# Patient Record
Sex: Male | Born: 1975
Health system: Southern US, Community
[De-identification: ages and names within clinical notes are randomized; demographics above are authoritative.]

---

## 2004-01-30 ENCOUNTER — Ambulatory Visit (HOSPITAL_COMMUNITY): Admission: RE | Admit: 2004-01-30 | Discharge: 2004-01-31 | Payer: Self-pay | Admitting: Neurosurgery

## 2004-04-03 ENCOUNTER — Inpatient Hospital Stay (HOSPITAL_COMMUNITY): Admission: AD | Admit: 2004-04-03 | Discharge: 2004-04-05 | Payer: Self-pay | Admitting: Neurosurgery

## 2017-07-31 ENCOUNTER — Ambulatory Visit (HOSPITAL_COMMUNITY): Payer: Self-pay | Admitting: Psychiatry

## 2017-08-12 ENCOUNTER — Encounter: Payer: Self-pay | Admitting: Podiatry

## 2017-08-12 ENCOUNTER — Encounter (INDEPENDENT_AMBULATORY_CARE_PROVIDER_SITE_OTHER): Payer: Self-pay

## 2017-08-12 ENCOUNTER — Ambulatory Visit (INDEPENDENT_AMBULATORY_CARE_PROVIDER_SITE_OTHER): Payer: 59 | Admitting: Podiatry

## 2017-08-12 VITALS — BP 113/66 | HR 82 | Ht 72.0 in | Wt 365.0 lb

## 2017-08-12 DIAGNOSIS — B351 Tinea unguium: Secondary | ICD-10-CM

## 2017-08-12 NOTE — Patient Instructions (Signed)
Pre-Operative Instructions  Congratulations, you have decided to take an important step towards improving your quality of life.  You can be assured that the doctors and staff at [facility] will be with you every step of the way.  Here are some important things you should know:  1. Plan to be at the surgery center/hospital at least 1 (one) hour prior to your scheduled time, unless otherwise directed by the surgical center/hospital staff.  You must have a responsible adult accompany you, remain during the surgery and drive you home.  Make sure you have directions to the surgical center/hospital to ensure you arrive on time. 2. If you are having surgery at [facility] or [facility], you will need a copy of your medical history and physical form from your family physician within one month prior to the date of surgery. We will give you a form for your primary physician to complete.  3. We make every effort to accommodate the date you request for surgery.  However, there are times where surgery dates or times have to be moved.  We will contact you as soon as possible if a change in schedule is required.   4. No aspirin/ibuprofen for one week before surgery.  If you are on aspirin, any non-steroidal anti-inflammatory medications (Mobic, Aleve, Ibuprofen) should not be taken seven (7) days prior to your surgery.  You make take Tylenol for pain prior to surgery.  5. Medications - If you are taking daily heart and blood pressure medications, seizure, reflux, allergy, asthma, anxiety, pain or diabetes medications, make sure you notify the surgery center/hospital before the day of surgery so they can tell you which medications you should take or avoid the day of surgery. 6. No food or drink after midnight the night before surgery unless directed otherwise by surgical center/hospital staff. 7. No alcoholic beverages 24-hours prior to surgery.  No smoking 24-hours prior or 24-hours after  surgery. 8. Wear loose pants or shorts. They should be loose enough to fit over bandages, boots, and casts. 9. Don't wear slip-on shoes. Sneakers are preferred. 10. Bring your boot with you to the surgery center/hospital.  Also bring crutches or a walker if your physician has prescribed it for you.  If you do not have this equipment, it will be provided for you after surgery. 11. If you have not been contacted by the surgery center/hospital by the day before your surgery, call to confirm the date and time of your surgery. 12. Leave-time from work may vary depending on the type of surgery you have.  Appropriate arrangements should be made prior to surgery with your employer. 13. Prescriptions will be provided immediately following surgery by your doctor.  Fill these as soon as possible after surgery and take the medication as directed. Pain medications will not be refilled on weekends and must be approved by the doctor. 14. Remove nail polish on the operative foot and avoid getting pedicures prior to surgery. 15. Wash the night before surgery.  The night before surgery wash the foot and leg well with water and the antibacterial soap provided. Be sure to pay special attention to beneath the toenails and in between the toes.  Wash for at least three (3) minutes. Rinse thoroughly with water and dry well with a towel.  Perform this wash unless told not to do so by your physician.  Enclosed: 1 Ice pack (please put in freezer the night before surgery)   1 Hibiclens skin cleaner     Pre-op instructions  If you have any questions regarding the instructions, please do not hesitate to call our office.  [place]: [address], [place], [place] [address] -- [phone]  [place]: [address] [place], [place] [address] -- [phone]  [place]: [address]  [place], [place] [address] -- [phone]  [place]: [address], [address], [place], [place] [address] -- [phone]  Website:  [email or web address] 

## 2017-08-17 NOTE — Progress Notes (Signed)
  Subjective:  Patient ID: Todd Jock., male    DOB: [birth year],  MRN: [id number] HPI Chief Complaint  Patient presents with  . Nail Problem    possible fungus nails not growing attached wants nails removed   hx of drug abuse     41 y.o. male presents with the above complaint. Reports fungal nails that do not grow correctly. Desires permanent removal. Has not tried any other treatments.  Patient has a hx of drug abuse and does not want any pain medications. No past medical history on file. No past surgical history on file.  Current Outpatient Prescriptions:  .  fluvoxaMINE (LUVOX) 50 MG tablet, Take 50 mg by mouth at bedtime., Disp: , Rfl:  .  lisinopril (PRINIVIL,ZESTRIL) 20 MG tablet, Take 20 mg by mouth daily., Disp: , Rfl:   No Known Allergies Review of Systems Objective:   Vitals:   08/12/17 0900  BP: 113/66  Pulse: 82   General AA&O x3. Normal mood and affect.  Vascular Dorsalis pedis and posterior tibial pulses 2/4 bilat. Brisk capillary refill to all digits. Pedal hair present.  Neurologic Epicritic sensation grossly intact.  Dermatologic No open lesions. Interspaces clear of maceration. Nails with yellow discoloration, lysis. L hallux nail with exposed raw nail bed  Orthopedic: MMT 5/5 in dorsiflexion, plantarflexion, inversion, and eversion. Normal joint ROM without pain or crepitus.   Radiographs: Taken and reviewed. No acute fractures or dislocations. No other osseous abnormalities. Assessment & Plan:  Patient was evaluated and treated and all questions answered  Onychomycosis with Onycholysis, Bilat -Patient desires permanent removal. -Will plan for surgical matrixectomy. Patient desires a surgical date on a Friday. -Will avoid drugs in the post-operative period d/t patient's hx of abuse. Patient explicit he does not want any controlled substances.  No Follow-up on file.

## 2017-09-09 ENCOUNTER — Ambulatory Visit (INDEPENDENT_AMBULATORY_CARE_PROVIDER_SITE_OTHER): Payer: 59 | Admitting: Podiatry

## 2017-09-09 DIAGNOSIS — L603 Nail dystrophy: Secondary | ICD-10-CM

## 2017-09-09 MED ORDER — NEOMYCIN-POLYMYXIN-HC 3.5-10000-1 OT SOLN: OTIC | 0 refills | Status: AC

## 2017-09-09 NOTE — Patient Instructions (Addendum)

## 2017-09-09 NOTE — Progress Notes (Addendum)
  Subjective:  Patient ID: Todd JockLewis G Lac Jr., male    DOB: [birth year],  MRN: [id number]  Chief Complaint  Patient presents with  . Nail Problem    Pt removed left 1st nail himself x 3days ago, pt still wants right 1st nail removed   41 y.o. male returns for the above complaint.  States that he could not get time off of work for surgery as planned.  Still wishes to proceed with permanent excision of his great toenails. States that 3 days ago he removed his left first great toenail.  Objective:  There were no vitals filed for this visit. There were no vitals filed for this visit. General AA&O x3. Normal mood and affect.  Vascular Pedal pulses palpable.  Neurologic Epicritic sensation grossly intact.  Dermatologic L Hallux nail with healing nail bed indicative of recent avulsion R hallux nail with dystrophic growth, no evidence of ingrowing nail  Orthopedic: No pain to palpation either foot.   Assessment & Plan:  Patient was evaluated and treated and all questions answered.  Onychodystrophy -Patient wishes to proceed with permanent removal of his toenails. -Matrixectomy both great toes performed as below. -Rx corticosporin otic  Procedure: Permanent Nail Removal / Excision of Nail Matrix Location: Bilateral 1st toes  Anesthesia: Lidocaine 1% plain; 3mL and Marcaine 0.5% plain; 3mL, digital block each toe. Skin Prep: Alcohol. Dressing: Silvadene; telfa; dry, sterile, compression dressing. Technique: Following skin prep, the toe was exsanguinated and a tourniquet was secured at the base of the toe. At the left toe where prior avulsion evident. Nail bed gently curettaged. The right hallux nail was freed and avulsed. Chemical matrixectomy was then performed of both great toes with phenol and irrigated out with alcohol. The tourniquet was then removed and sterile dressing applied. Disposition: Patient tolerated procedure well. Patient to return in 2 weeks for follow-up.  Return in  about 2 weeks (around 09/23/2017).

## 2017-09-23 ENCOUNTER — Ambulatory Visit: Payer: 59 | Admitting: Podiatry

## 2018-01-14 DIAGNOSIS — E785 Hyperlipidemia, unspecified: Secondary | ICD-10-CM | POA: Diagnosis not present

## 2018-01-14 DIAGNOSIS — I10 Essential (primary) hypertension: Secondary | ICD-10-CM | POA: Diagnosis not present

## 2018-03-19 DIAGNOSIS — J019 Acute sinusitis, unspecified: Secondary | ICD-10-CM | POA: Diagnosis not present

## 2018-03-19 DIAGNOSIS — E669 Obesity, unspecified: Secondary | ICD-10-CM | POA: Diagnosis not present

## 2018-05-11 DIAGNOSIS — R1030 Lower abdominal pain, unspecified: Secondary | ICD-10-CM | POA: Diagnosis not present

## 2018-05-11 DIAGNOSIS — N451 Epididymitis: Secondary | ICD-10-CM | POA: Diagnosis not present

## 2018-09-21 DIAGNOSIS — R05 Cough: Secondary | ICD-10-CM | POA: Diagnosis not present

## 2018-09-21 DIAGNOSIS — I10 Essential (primary) hypertension: Secondary | ICD-10-CM | POA: Diagnosis not present

## 2018-09-22 DIAGNOSIS — R062 Wheezing: Secondary | ICD-10-CM | POA: Diagnosis not present

## 2018-11-25 DIAGNOSIS — J00 Acute nasopharyngitis [common cold]: Secondary | ICD-10-CM | POA: Diagnosis not present

## 2018-12-11 DIAGNOSIS — G4733 Obstructive sleep apnea (adult) (pediatric): Secondary | ICD-10-CM | POA: Diagnosis not present

## 2018-12-11 DIAGNOSIS — R5383 Other fatigue: Secondary | ICD-10-CM | POA: Diagnosis not present

## 2018-12-11 DIAGNOSIS — E559 Vitamin D deficiency, unspecified: Secondary | ICD-10-CM | POA: Diagnosis not present

## 2018-12-11 DIAGNOSIS — R062 Wheezing: Secondary | ICD-10-CM | POA: Diagnosis not present

## 2018-12-22 DIAGNOSIS — I10 Essential (primary) hypertension: Secondary | ICD-10-CM | POA: Diagnosis not present

## 2018-12-22 DIAGNOSIS — Z8619 Personal history of other infectious and parasitic diseases: Secondary | ICD-10-CM | POA: Diagnosis not present

## 2018-12-22 DIAGNOSIS — Z0001 Encounter for general adult medical examination with abnormal findings: Secondary | ICD-10-CM | POA: Diagnosis not present

## 2018-12-29 DIAGNOSIS — L720 Epidermal cyst: Secondary | ICD-10-CM | POA: Diagnosis not present

## 2018-12-29 DIAGNOSIS — R233 Spontaneous ecchymoses: Secondary | ICD-10-CM | POA: Diagnosis not present

## 2018-12-29 DIAGNOSIS — L219 Seborrheic dermatitis, unspecified: Secondary | ICD-10-CM | POA: Diagnosis not present

## 2018-12-29 DIAGNOSIS — L578 Other skin changes due to chronic exposure to nonionizing radiation: Secondary | ICD-10-CM | POA: Diagnosis not present

## 2019-01-15 DIAGNOSIS — G4733 Obstructive sleep apnea (adult) (pediatric): Secondary | ICD-10-CM | POA: Diagnosis not present

## 2019-01-20 DIAGNOSIS — G4733 Obstructive sleep apnea (adult) (pediatric): Secondary | ICD-10-CM | POA: Diagnosis not present

## 2019-01-20 DIAGNOSIS — R062 Wheezing: Secondary | ICD-10-CM | POA: Diagnosis not present

## 2019-01-20 DIAGNOSIS — R5383 Other fatigue: Secondary | ICD-10-CM | POA: Diagnosis not present

## 2019-01-20 DIAGNOSIS — J019 Acute sinusitis, unspecified: Secondary | ICD-10-CM | POA: Diagnosis not present

## 2019-03-11 DIAGNOSIS — H66009 Acute suppurative otitis media without spontaneous rupture of ear drum, unspecified ear: Secondary | ICD-10-CM | POA: Diagnosis not present

## 2019-03-11 DIAGNOSIS — J069 Acute upper respiratory infection, unspecified: Secondary | ICD-10-CM | POA: Diagnosis not present

## 2019-03-12 DIAGNOSIS — G4733 Obstructive sleep apnea (adult) (pediatric): Secondary | ICD-10-CM | POA: Diagnosis not present

## 2019-03-16 DIAGNOSIS — E559 Vitamin D deficiency, unspecified: Secondary | ICD-10-CM | POA: Diagnosis not present

## 2019-03-16 DIAGNOSIS — G4733 Obstructive sleep apnea (adult) (pediatric): Secondary | ICD-10-CM | POA: Diagnosis not present

## 2019-03-18 DIAGNOSIS — G4733 Obstructive sleep apnea (adult) (pediatric): Secondary | ICD-10-CM | POA: Diagnosis not present

## 2019-03-24 DIAGNOSIS — J309 Allergic rhinitis, unspecified: Secondary | ICD-10-CM | POA: Diagnosis not present

## 2019-03-24 DIAGNOSIS — H9201 Otalgia, right ear: Secondary | ICD-10-CM | POA: Diagnosis not present

## 2021-03-16 ENCOUNTER — Encounter: Payer: Self-pay | Admitting: Sports Medicine

## 2021-03-16 ENCOUNTER — Ambulatory Visit (INDEPENDENT_AMBULATORY_CARE_PROVIDER_SITE_OTHER): Payer: BC Managed Care – PPO | Admitting: Sports Medicine

## 2021-03-16 ENCOUNTER — Other Ambulatory Visit: Payer: Self-pay

## 2021-03-16 DIAGNOSIS — L03031 Cellulitis of right toe: Secondary | ICD-10-CM

## 2021-03-16 DIAGNOSIS — M86171 Other acute osteomyelitis, right ankle and foot: Secondary | ICD-10-CM | POA: Diagnosis not present

## 2021-03-16 DIAGNOSIS — M86011 Acute hematogenous osteomyelitis, right shoulder: Secondary | ICD-10-CM

## 2021-03-16 DIAGNOSIS — S91116A Laceration without foreign body of unspecified lesser toe(s) without damage to nail, initial encounter: Secondary | ICD-10-CM | POA: Diagnosis not present

## 2021-03-16 DIAGNOSIS — T8130XA Disruption of wound, unspecified, initial encounter: Secondary | ICD-10-CM | POA: Diagnosis not present

## 2021-03-16 DIAGNOSIS — L02611 Cutaneous abscess of right foot: Secondary | ICD-10-CM

## 2021-03-16 MED ORDER — SULFAMETHOXAZOLE-TRIMETHOPRIM 800-160 MG PO TABS: 1.0000 | ORAL_TABLET | Freq: Two times a day (BID) | ORAL | 0 refills | Status: AC

## 2021-03-16 MED ORDER — HYDROCODONE-ACETAMINOPHEN 10-325 MG PO TABS: 1.0000 | ORAL_TABLET | Freq: Four times a day (QID) | ORAL | 0 refills | Status: DC | PRN

## 2021-03-16 NOTE — Progress Notes (Signed)
Subjective: Todd Jock. is a 45 y.o. male patient seen in office for evaluation of wound to the right second toe reports that he injured his toe on 03/05/2021 and then went to the ER the next day on 03/06/2021 had it x-rayed and they sewed up the skin and he went back to work and worked three 12-hour shifts and reports that the more he works the worst the toe has gotten states that he is currently taking clindamycin antibiotic as provided by the ED at his last visit.  And reports that the pain is pretty severe in the right second toe.  Currently is using a surgical shoe but has been still trying to work and states that he was limping so bad at work that his boss told him he needed to take care of his toe.  He reports he has been applying antibiotic cream and a gauze dressing to the area daily.  Review of systems noncontributory besides toe pain  There are no problems to display for this patient.  Current Outpatient Medications on File Prior to Visit  Medication Sig Dispense Refill  . ibuprofen (ADVIL) 200 MG tablet Take by mouth.    Marland Kitchen albuterol (VENTOLIN HFA) 108 (90 Base) MCG/ACT inhaler SMARTSIG:1-2 Puff(s) By Mouth PRN    . ciprofloxacin (CIPRO) 500 MG tablet Take 500 mg by mouth 2 (two) times daily.    . clindamycin (CLEOCIN) 300 MG capsule Take 300 mg by mouth 3 (three) times daily.    Marland Kitchen doxycycline (ADOXA) 100 MG tablet Take 100 mg by mouth 2 (two) times daily.    . fluvoxaMINE (LUVOX) 50 MG tablet Take 50 mg by mouth at bedtime.    Marland Kitchen lisinopril (PRINIVIL,ZESTRIL) 20 MG tablet Take 20 mg by mouth daily.    Marland Kitchen neomycin-polymyxin-hydrocortisone (CORTISPORIN) OTIC solution Apply 2 drops to the ingrown toenail site twice daily. Cover with band-aid. 10 mL 0  . phentermine (ADIPEX-P) 37.5 MG tablet Take 37.5 mg by mouth daily.    Marland Kitchen testosterone cypionate (DEPOTESTOSTERONE CYPIONATE) 200 MG/ML injection Inject into the muscle.    . topiramate (TOPAMAX) 50 MG tablet Take 50 mg by mouth  every morning.     No current facility-administered medications on file prior to visit.   No Known Allergies  No results found for this or any previous visit (from the past 2160 hour(s)).  Objective: There were no vitals filed for this visit.  General: Patient is awake, alert, oriented x 3 and in no acute distress.  Dermatology: Skin is warm and dry bilateral with a full thickness ulceration present  Distal tuft medial aspect of the right second toe that measures 3 x 1 x0.4 centimeters with loose suture materials that have dehisced with a fatty tissue base exposed with concern for possible osteomyelitis since there is a deep laceration here from previous injury that happened on 03/05/2021. There is no malodor, clear to yellow active drainage, localized erythema, localized edema.  Vascular: Dorsalis Pedis pulse = 1/4 Bilateral,  Posterior Tibial pulse = 1/4 Bilateral,  Capillary Fill Time < 5 seconds  Neurologic: Protective sensation present bilateral.  Musculosketal: There is significant pain to palpation to right second toe.  There is digital contracture noted at the second toe likely consistent with hammertoe deformity with history of recent injury 03/05/2021.  X-rays and CT reviewed from [facility] which reveals no acute osseous abnormalities however it does suggest that if we are looking for an abscess or osteomyelitis to order a MRI  No  results for input(s): GRAMSTAIN, LABORGA in the last 8760 hours.  Assessment and Plan:  Problem List Items Addressed This Visit   None   Visit Diagnoses    Abscess or cellulitis of toe, right    -  Primary   Relevant Orders   WOUND CULTURE   Laceration of second toe       Wound dehiscence       Acute osteomyelitis of right ankle or foot (HCC)       Relevant Medications   clindamycin (CLEOCIN) 300 MG capsule   sulfamethoxazole-trimethoprim (BACTRIM DS) 800-160 MG tablet   Acute hematogenous osteomyelitis of right shoulder region (HCC)        Relevant Medications   clindamycin (CLEOCIN) 300 MG capsule   sulfamethoxazole-trimethoprim (BACTRIM DS) 800-160 MG tablet   Other Relevant Orders   MR TOES RIGHT WO CONTRAST       -Examined patient and discussed the progression of the wound and treatment alternatives. -Imaging reviewed from [facility] -MRI ordered to evaluate for osteomyelitis of right second toe since previous advanced images were negative -Cleansed wound at right second toe -Sutures removed since they were not holding -Wound culture obtained we will call patient if he needs to change his antibiotics however preemptively added on Bactrim for patient to start once he finishes his clindamycin -Applied antibiotic cream and dry sterile dressing and instructed patient to continue with daily dressings at home consisting of the same. -Refill Norco for pain -Continue with surgical shoe - Advised patient to go to the ER or return to office if the wound worsens or if constitutional symptoms are present. -Advised patient at minimum take 3 weeks out of work -Patient to return to office in after MRI or sooner if problems arise.  Asencion Islam, DPM

## 2021-03-19 DIAGNOSIS — M79676 Pain in unspecified toe(s): Secondary | ICD-10-CM

## 2021-03-21 ENCOUNTER — Other Ambulatory Visit: Payer: Self-pay | Admitting: Sports Medicine

## 2021-03-21 ENCOUNTER — Other Ambulatory Visit: Payer: Self-pay

## 2021-03-21 ENCOUNTER — Telehealth: Payer: Self-pay | Admitting: Sports Medicine

## 2021-03-21 DIAGNOSIS — L03031 Cellulitis of right toe: Secondary | ICD-10-CM

## 2021-03-21 DIAGNOSIS — L02611 Cutaneous abscess of right foot: Secondary | ICD-10-CM

## 2021-03-21 MED ORDER — HYDROCODONE-ACETAMINOPHEN 10-325 MG PO TABS: 1.0000 | ORAL_TABLET | Freq: Four times a day (QID) | ORAL | 0 refills | Status: DC | PRN

## 2021-03-21 NOTE — Telephone Encounter (Signed)
Pt req pain med refill [facility] [place]  Pt has appt 03-26-21 for MRI

## 2021-03-21 NOTE — Progress Notes (Signed)
Refilled pain meds 

## 2021-03-22 ENCOUNTER — Other Ambulatory Visit: Payer: Self-pay | Admitting: Sports Medicine

## 2021-03-22 ENCOUNTER — Telehealth: Payer: Self-pay | Admitting: Sports Medicine

## 2021-03-22 MED ORDER — HYDROCODONE-ACETAMINOPHEN 10-325 MG PO TABS
1.0000 | ORAL_TABLET | Freq: Four times a day (QID) | ORAL | 0 refills | Status: AC | PRN
Start: 2021-03-22 — End: 2021-03-27

## 2021-03-22 NOTE — Progress Notes (Signed)
Pain meds changed to the correct pharmacy

## 2021-03-22 NOTE — Telephone Encounter (Signed)
Pt's rx was sent to [facility] instead of [facility] [place]-per pt pharmacy states rx would have to be cancelled with [facility]  In order to send [facility] [place]

## 2021-03-22 NOTE — Telephone Encounter (Signed)
[facility] sorry it defaulted to the other pharmacy instead

## 2021-03-23 LAB — WOUND CULTURE

## 2021-03-26 ENCOUNTER — Other Ambulatory Visit: Payer: Self-pay

## 2021-03-27 ENCOUNTER — Telehealth: Payer: Self-pay | Admitting: Sports Medicine

## 2021-03-27 NOTE — Telephone Encounter (Signed)
Called patient to review his MRI results which he had on 03/24/2021 when he went to the ED at [facility].  Patient did not answer I left a voicemail explaining to patient that his MRI was negative for any acute osteomyelitis or bone infection.  I advised patient to continue with local wound care and finishing off all antibiotics that were previously prescribed.  I advised patient to call office to make a follow-up for a wound check on the right second toe on next week.  Callback number provided. -Dr. Marylene Land

## 2021-03-28 LAB — WOUND CULTURE

## 2021-04-04 ENCOUNTER — Encounter: Payer: Self-pay | Admitting: Sports Medicine

## 2021-04-04 ENCOUNTER — Ambulatory Visit (INDEPENDENT_AMBULATORY_CARE_PROVIDER_SITE_OTHER): Payer: BC Managed Care – PPO | Admitting: Sports Medicine

## 2021-04-04 ENCOUNTER — Other Ambulatory Visit: Payer: Self-pay

## 2021-04-04 DIAGNOSIS — L02611 Cutaneous abscess of right foot: Secondary | ICD-10-CM

## 2021-04-04 DIAGNOSIS — M79671 Pain in right foot: Secondary | ICD-10-CM | POA: Diagnosis not present

## 2021-04-04 DIAGNOSIS — L97511 Non-pressure chronic ulcer of other part of right foot limited to breakdown of skin: Secondary | ICD-10-CM

## 2021-04-04 DIAGNOSIS — L03031 Cellulitis of right toe: Secondary | ICD-10-CM | POA: Diagnosis not present

## 2021-04-04 DIAGNOSIS — S91116A Laceration without foreign body of unspecified lesser toe(s) without damage to nail, initial encounter: Secondary | ICD-10-CM | POA: Diagnosis not present

## 2021-04-04 NOTE — Progress Notes (Addendum)
Subjective: Todd Jock. is a 45 y.o. male patient seen in office for evaluation of wound to the right second toe secondary to injury that happened on 03/12/2021 and for MRI results. He states that he got my voicemail with the results and that he thinks that its doing better but has another sore at the bottom of the heel. He reports he has been applying antibiotic cream and a gauze dressing to the areas daily.  Patient denies any other pedal complaints at this time.  However patient is concerned about when he should return to work and is concerned that because he is in work boots that this may cause the wound to worsen.  Denies nausea vomiting fever chills or any constitutional symptoms at this time.  There are no problems to display for this patient.  Current Outpatient Medications on File Prior to Visit  Medication Sig Dispense Refill  . albuterol (VENTOLIN HFA) 108 (90 Base) MCG/ACT inhaler SMARTSIG:1-2 Puff(s) By Mouth PRN    . buprenorphine-naloxone (SUBOXONE) 8-2 mg SUBL SL tablet Place 1 tablet under the tongue 2 (two) times daily.    . ciprofloxacin (CIPRO) 500 MG tablet Take 500 mg by mouth 2 (two) times daily.    . clindamycin (CLEOCIN) 300 MG capsule Take 300 mg by mouth 3 (three) times daily.    Marland Kitchen doxycycline (ADOXA) 100 MG tablet Take 100 mg by mouth 2 (two) times daily.    . fluvoxaMINE (LUVOX) 50 MG tablet Take 50 mg by mouth at bedtime.    Marland Kitchen ibuprofen (ADVIL) 200 MG tablet Take by mouth.    Marland Kitchen lisinopril (PRINIVIL,ZESTRIL) 20 MG tablet Take 20 mg by mouth daily.    . meloxicam (MOBIC) 7.5 MG tablet Take 7.5 mg by mouth 2 (two) times daily as needed.    . neomycin-polymyxin-hydrocortisone (CORTISPORIN) OTIC solution Apply 2 drops to the ingrown toenail site twice daily. Cover with band-aid. 10 mL 0  . phentermine (ADIPEX-P) 37.5 MG tablet Take 37.5 mg by mouth daily.    Marland Kitchen sulfamethoxazole-trimethoprim (BACTRIM DS) 800-160 MG tablet Take 1 tablet by mouth 2 (two) times daily.  28 tablet 0  . testosterone cypionate (DEPOTESTOSTERONE CYPIONATE) 200 MG/ML injection Inject into the muscle.    . topiramate (TOPAMAX) 50 MG tablet Take 50 mg by mouth every morning.     No current facility-administered medications on file prior to visit.   No Known Allergies  Recent Results (from the past 2160 hour(s))  WOUND CULTURE     Status: Abnormal   Collection Time: 03/21/21  8:09 AM   Specimen: Foot, Right; Wound   Wound Culture and sens  Result Value Ref Range   Gram Stain Result Final report    Organism ID, Bacteria Comment     Comment: No white blood cells seen.   Organism ID, Bacteria Comment     Comment: Many gram positive cocci.   Organism ID, Bacteria Comment     Comment: Moderate gram negative rods.   Organism ID, Bacteria Comment     Comment: Few gram positive rods.   Aerobic Bacterial Culture Final report (A)    Organism ID, Bacteria Comment (A)     Comment: Pseudomonas aeruginosa Heavy growth    Organism ID, Bacteria Routine flora     Comment: Light growth   Organism ID, Bacteria Comment (A)     Comment: Beta hemolytic Streptococcus, group B Heavy growth Penicillin and ampicillin are drugs of choice for treatment of beta-hemolytic streptococcal infections. Susceptibility testing  of penicillins and other beta-lactam agents approved by the FDA for treatment of beta-hemolytic streptococcal infections need not be performed routinely because nonsusceptible isolates are extremely rare in any beta-hemolytic streptococcus and have not been reported for Streptococcus pyogenes (group A). (CLSI)    Antimicrobial Susceptibility Comment     Comment:       ** S = Susceptible; I = Intermediate; R = Resistant **                    P = Positive; N = Negative             MICS are expressed in micrograms per mL    Antibiotic                 RSLT#1    RSLT#2    RSLT#3    RSLT#4 Amikacin                       S Cefepime                       S Ceftazidime                     S Ciprofloxacin                  S Gentamicin                     S Imipenem                       S Levofloxacin                   S Meropenem                      S Piperacillin                   S Ticarcillin                    S Tobramycin                     S     Objective: There were no vitals filed for this visit.  General: Patient is awake, alert, oriented x 3 and in no acute distress.  Dermatology: Skin is warm and dry bilateral with a full thickness ulceration present at the distal tuft medial aspect of the right second toe that measures 2.5x 1 x0.2 centimeters with granular wound bed, There is no malodor, clear to yellow active drainage, localized erythema, localized edema. To heel there is a partial thickness skin tear measures 2x1cm with no signs of infection.  No residual foreign body noted.  Vascular: Dorsalis Pedis pulse = 1/4 Bilateral,  Posterior Tibial pulse = 1/4 Bilateral,  Capillary Fill Time < 5 seconds  Neurologic: Protective sensation present bilateral.  Musculosketal: There is reduced pain to palpation to right second toe or heel.  There is digital contracture noted at the second toe likely consistent with hammertoe deformity.  MRI is negative for Osteomyelitis   No results for input(s): GRAMSTAIN, LABORGA in the last 8760 hours.  Assessment and Plan:  Problem List Items Addressed This Visit   None   Visit Diagnoses    Laceration of second toe    -  Primary   Right foot ulcer, limited to breakdown  of skin (HCC)       Abscess or cellulitis of toe, right       Right foot pain         -Re- discussed the progression of the wound and treatment alternatives. -MRI results reviewed again with patient negative for osteomyelitis -Cleansed wound at right second toe and plantar heel -Applied antibiotic cream and dry sterile dressing and instructed patient to continue with daily dressings at home consisting of the same. -Continue with surgical shoe or a  shoe that does not rub the toe. -Oral antibiotics have been completed at this time - Advised patient to go to the ER or return to office if the wound worsens or if constitutional symptoms are present. -Out of work until 6/29, extended time off by 1 month since he has a wound that still needs time to heal -Patient to return to office 2 weeks for follow-up care or sooner if problems arise.  Asencion Islam, DPM

## 2021-04-18 ENCOUNTER — Encounter: Payer: Self-pay | Admitting: Sports Medicine

## 2021-04-18 ENCOUNTER — Other Ambulatory Visit: Payer: Self-pay

## 2021-04-18 ENCOUNTER — Ambulatory Visit (INDEPENDENT_AMBULATORY_CARE_PROVIDER_SITE_OTHER): Payer: BC Managed Care – PPO | Admitting: Sports Medicine

## 2021-04-18 DIAGNOSIS — S91116A Laceration without foreign body of unspecified lesser toe(s) without damage to nail, initial encounter: Secondary | ICD-10-CM | POA: Diagnosis not present

## 2021-04-18 DIAGNOSIS — L03031 Cellulitis of right toe: Secondary | ICD-10-CM

## 2021-04-18 DIAGNOSIS — M79671 Pain in right foot: Secondary | ICD-10-CM

## 2021-04-18 DIAGNOSIS — L97511 Non-pressure chronic ulcer of other part of right foot limited to breakdown of skin: Secondary | ICD-10-CM | POA: Diagnosis not present

## 2021-04-18 DIAGNOSIS — L02611 Cutaneous abscess of right foot: Secondary | ICD-10-CM

## 2021-04-18 NOTE — Progress Notes (Signed)
Subjective: Todd Jock. is a 45 y.o. male patient seen in office for evaluation of wound to the right second toe secondary to injury that happened on 03/12/2021. He reports he has been applying antibiotic cream and area is looking better healing good slowly and less pain.  Denies current constitutional symptoms at this time.  There are no problems to display for this patient.  Current Outpatient Medications on File Prior to Visit  Medication Sig Dispense Refill  . albuterol (VENTOLIN HFA) 108 (90 Base) MCG/ACT inhaler SMARTSIG:1-2 Puff(s) By Mouth PRN    . buprenorphine-naloxone (SUBOXONE) 8-2 mg SUBL SL tablet Place 1 tablet under the tongue 2 (two) times daily.    . ciprofloxacin (CIPRO) 500 MG tablet Take 500 mg by mouth 2 (two) times daily.    . clindamycin (CLEOCIN) 300 MG capsule Take 300 mg by mouth 3 (three) times daily.    Marland Kitchen doxycycline (ADOXA) 100 MG tablet Take 100 mg by mouth 2 (two) times daily.    . fluvoxaMINE (LUVOX) 50 MG tablet Take 50 mg by mouth at bedtime.    Marland Kitchen ibuprofen (ADVIL) 200 MG tablet Take by mouth.    Marland Kitchen lisinopril (PRINIVIL,ZESTRIL) 20 MG tablet Take 20 mg by mouth daily.    . meloxicam (MOBIC) 7.5 MG tablet Take 7.5 mg by mouth 2 (two) times daily as needed.    . neomycin-polymyxin-hydrocortisone (CORTISPORIN) OTIC solution Apply 2 drops to the ingrown toenail site twice daily. Cover with band-aid. 10 mL 0  . phentermine (ADIPEX-P) 37.5 MG tablet Take 37.5 mg by mouth daily.    Marland Kitchen sulfamethoxazole-trimethoprim (BACTRIM DS) 800-160 MG tablet Take 1 tablet by mouth 2 (two) times daily. 28 tablet 0  . testosterone cypionate (DEPOTESTOSTERONE CYPIONATE) 200 MG/ML injection Inject into the muscle.    . topiramate (TOPAMAX) 50 MG tablet Take 50 mg by mouth every morning.     No current facility-administered medications on file prior to visit.   No Known Allergies  Recent Results (from the past 2160 hour(s))  WOUND CULTURE     Status: Abnormal    Collection Time: 03/21/21  8:09 AM   Specimen: Foot, Right; Wound   Wound Culture and sens  Result Value Ref Range   Gram Stain Result Final report    Organism ID, Bacteria Comment     Comment: No white blood cells seen.   Organism ID, Bacteria Comment     Comment: Many gram positive cocci.   Organism ID, Bacteria Comment     Comment: Moderate gram negative rods.   Organism ID, Bacteria Comment     Comment: Few gram positive rods.   Aerobic Bacterial Culture Final report (A)    Organism ID, Bacteria Comment (A)     Comment: Pseudomonas aeruginosa Heavy growth    Organism ID, Bacteria Routine flora     Comment: Light growth   Organism ID, Bacteria Comment (A)     Comment: Beta hemolytic Streptococcus, group B Heavy growth Penicillin and ampicillin are drugs of choice for treatment of beta-hemolytic streptococcal infections. Susceptibility testing of penicillins and other beta-lactam agents approved by the FDA for treatment of beta-hemolytic streptococcal infections need not be performed routinely because nonsusceptible isolates are extremely rare in any beta-hemolytic streptococcus and have not been reported for Streptococcus pyogenes (group A). (CLSI)    Antimicrobial Susceptibility Comment     Comment:       ** S = Susceptible; I = Intermediate; R = Resistant **  P = Positive; N = Negative             MICS are expressed in micrograms per mL    Antibiotic                 RSLT#1    RSLT#2    RSLT#3    RSLT#4 Amikacin                       S Cefepime                       S Ceftazidime                    S Ciprofloxacin                  S Gentamicin                     S Imipenem                       S Levofloxacin                   S Meropenem                      S Piperacillin                   S Ticarcillin                    S Tobramycin                     S     Objective: There were no vitals filed for this visit.  General: Patient is  awake, alert, oriented x 3 and in no acute distress.  Dermatology: Skin is warm and dry bilateral with a full thickness ulceration present at the distal tuft medial aspect of the right second toe that measures 1.5x 0.5 x0.2 centimeters with granular wound bed, There is no malodor, clear to yellow active drainage, localized erythema, localized edema, abrasion to right heel resolved.  Vascular: Dorsalis Pedis pulse = 1/4 Bilateral,  Posterior Tibial pulse = 1/4 Bilateral,  Capillary Fill Time < 5 seconds  Neurologic: Protective sensation present bilateral. . Musculosketal: There is reduced pain to palpation to right second toe. There is digital contracture noted at the second toe likely consistent with hammertoe deformity.  No results for input(s): GRAMSTAIN, LABORGA in the last 8760 hours.  Assessment and Plan:  Problem List Items Addressed This Visit   None   Visit Diagnoses    Laceration of second toe    -  Primary   Right foot ulcer, limited to breakdown of skin (HCC)       Abscess or cellulitis of toe, right       Right foot pain         -Re- discussed the progression of the wound and treatment alternatives -Cleansed wound at right second toe  -Applied antibiotic cream and bandaid to right 2nd toe and advised patient to continue daily with the same until healed. -Continue with surgical shoe or a shoe that does not rub the toe like previous - Advised patient to go to the ER or return to office if the wound worsens or if constitutional symptoms are present. -Out of work until 6/29 with likely return to work  on 6/30 if he is continuing to do well; disability paperwork provided  -Patient to return to office 2-3 weeks for follow-up care or sooner if problems arise.  Asencion Islam, DPM

## 2021-05-09 ENCOUNTER — Other Ambulatory Visit: Payer: Self-pay

## 2021-05-09 ENCOUNTER — Encounter: Payer: Self-pay | Admitting: Sports Medicine

## 2021-05-09 ENCOUNTER — Ambulatory Visit (INDEPENDENT_AMBULATORY_CARE_PROVIDER_SITE_OTHER): Payer: BC Managed Care – PPO | Admitting: Sports Medicine

## 2021-05-09 DIAGNOSIS — M79671 Pain in right foot: Secondary | ICD-10-CM | POA: Diagnosis not present

## 2021-05-09 DIAGNOSIS — L97511 Non-pressure chronic ulcer of other part of right foot limited to breakdown of skin: Secondary | ICD-10-CM

## 2021-05-09 DIAGNOSIS — L03031 Cellulitis of right toe: Secondary | ICD-10-CM | POA: Diagnosis not present

## 2021-05-09 DIAGNOSIS — L02611 Cutaneous abscess of right foot: Secondary | ICD-10-CM

## 2021-05-09 DIAGNOSIS — S91116A Laceration without foreign body of unspecified lesser toe(s) without damage to nail, initial encounter: Secondary | ICD-10-CM | POA: Diagnosis not present

## 2021-05-09 NOTE — Progress Notes (Signed)
Subjective: Todd Hernandez. is a 45 y.o. male patient seen in office for evaluation of wound to the right second toe secondary to injury that happened on 03/12/2021. He reports he has not been doing anything; area healed and looks a lot better, a little pain, no other pedal complaints.  There are no problems to display for this patient.  Current Outpatient Medications on File Prior to Visit  Medication Sig Dispense Refill   albuterol (VENTOLIN HFA) 108 (90 Base) MCG/ACT inhaler SMARTSIG:1-2 Puff(s) By Mouth PRN     buprenorphine-naloxone (SUBOXONE) 8-2 mg SUBL SL tablet Place 1 tablet under the tongue 2 (two) times daily.     ciprofloxacin (CIPRO) 500 MG tablet Take 500 mg by mouth 2 (two) times daily.     clindamycin (CLEOCIN) 300 MG capsule Take 300 mg by mouth 3 (three) times daily.     doxycycline (ADOXA) 100 MG tablet Take 100 mg by mouth 2 (two) times daily.     fluvoxaMINE (LUVOX) 50 MG tablet Take 50 mg by mouth at bedtime.     ibuprofen (ADVIL) 200 MG tablet Take by mouth.     lisinopril (PRINIVIL,ZESTRIL) 20 MG tablet Take 20 mg by mouth daily.     meloxicam (MOBIC) 7.5 MG tablet Take 7.5 mg by mouth 2 (two) times daily as needed.     neomycin-polymyxin-hydrocortisone (CORTISPORIN) OTIC solution Apply 2 drops to the ingrown toenail site twice daily. Cover with band-aid. 10 mL 0   phentermine (ADIPEX-P) 37.5 MG tablet Take 37.5 mg by mouth daily.     sulfamethoxazole-trimethoprim (BACTRIM DS) 800-160 MG tablet Take 1 tablet by mouth 2 (two) times daily. 28 tablet 0   testosterone cypionate (DEPOTESTOSTERONE CYPIONATE) 200 MG/ML injection Inject into the muscle.     topiramate (TOPAMAX) 50 MG tablet Take 50 mg by mouth every morning.     No current facility-administered medications on file prior to visit.   No Known Allergies  Recent Results (from the past 2160 hour(s))  WOUND CULTURE     Status: Abnormal   Collection Time: 03/21/21  8:09 AM   Specimen: Foot, Right; Wound    Wound Culture and sens  Result Value Ref Range   Gram Stain Result Final report    Organism ID, Bacteria Comment     Comment: No white blood cells seen.   Organism ID, Bacteria Comment     Comment: Many gram positive cocci.   Organism ID, Bacteria Comment     Comment: Moderate gram negative rods.   Organism ID, Bacteria Comment     Comment: Few gram positive rods.   Aerobic Bacterial Culture Final report (A)    Organism ID, Bacteria Comment (A)     Comment: Pseudomonas aeruginosa Heavy growth    Organism ID, Bacteria Routine flora     Comment: Light growth   Organism ID, Bacteria Comment (A)     Comment: Beta hemolytic Streptococcus, group B Heavy growth Penicillin and ampicillin are drugs of choice for treatment of beta-hemolytic streptococcal infections. Susceptibility testing of penicillins and other beta-lactam agents approved by the FDA for treatment of beta-hemolytic streptococcal infections need not be performed routinely because nonsusceptible isolates are extremely rare in any beta-hemolytic streptococcus and have not been reported for Streptococcus pyogenes (group A). (CLSI)    Antimicrobial Susceptibility Comment     Comment:       ** S = Susceptible; I = Intermediate; R = Resistant **  P = Positive; N = Negative             MICS are expressed in micrograms per mL    Antibiotic                 RSLT#1    RSLT#2    RSLT#3    RSLT#4 Amikacin                       S Cefepime                       S Ceftazidime                    S Ciprofloxacin                  S Gentamicin                     S Imipenem                       S Levofloxacin                   S Meropenem                      S Piperacillin                   S Ticarcillin                    S Tobramycin                     S     Objective: There were no vitals filed for this visit.  General: Patient is awake, alert, oriented x 3 and in no acute distress.  Dermatology:  Skin is warm and dry bilateral with a healed laceration at right 2nd toe. Preulcerative lesion sub met 1 on right. .  Vascular: Dorsalis Pedis pulse = 1/4 Bilateral,  Posterior Tibial pulse = 1/4 Bilateral,  Capillary Fill Time < 5 seconds  Neurologic: Protective sensation present bilateral. . Musculosketal: There is reduced pain to palpation to right second toe. There is digital contracture noted at the second toe likely consistent with hammertoe deformity.  No results for input(s): GRAMSTAIN, LABORGA in the last 8760 hours.  Assessment and Plan:  Problem List Items Addressed This Visit   None Visit Diagnoses     Laceration of second toe    -  Primary   Right foot ulcer, limited to breakdown of skin ([place])       Healed   Abscess or cellulitis of toe, right       Right foot pain            -Examined patient -Wound/laceration to right 2nd toe is healed -Applied offloading padding to right shoe and advised patient to continue with a shoe that does not rub the toe like previous -Return to work 6/29; disability paperwork completed -Patient to return to office PRN or sooner if problems arise.  Landis Martins, DPM

## 2021-07-13 ENCOUNTER — Emergency Department (HOSPITAL_COMMUNITY)
Admission: EM | Admit: 2021-07-13 | Discharge: 2021-07-13 | Disposition: A | Discharge status: Home / Self Care | Payer: BC Managed Care – PPO | Attending: Emergency Medicine | Admitting: Emergency Medicine

## 2021-07-13 ENCOUNTER — Other Ambulatory Visit: Payer: Self-pay

## 2021-07-13 DIAGNOSIS — I83899 Varicose veins of unspecified lower extremities with other complications: Secondary | ICD-10-CM | POA: Diagnosis not present

## 2021-07-13 NOTE — ED Triage Notes (Signed)
Pt was sent from PCP for evaluation of burst varicose vein to R leg. Provider called triage nurse to notify that pressure and cauterization unsuccessful in stopping the bleed, but thought to be venous instead of arterial. Pt has calf bandaged with coban at this time.

## 2021-07-13 NOTE — ED Notes (Signed)
New dressing applied, gauze and coban used to secure previous bleeding site. No bleeding noted at this time.

## 2021-07-13 NOTE — ED Provider Notes (Signed)
[facility] EMERGENCY DEPARTMENT Provider Note   CSN: [id number] Arrival date & time: 07/13/21  1002     History Chief Complaint  Patient presents with   Varicose Veins    Todd Hernandez. is a 45 y.o. male.  Pt reports that he scratched a varicose vein and it continued to bleed.  Pt was seen and had area cauterized a pressure dressing placed before coming in here.    The history is provided by the patient. No language interpreter was used.      No past medical history on file.  There are no problems to display for this patient.   No past surgical history on file.     No family history on file.  Social History   Tobacco Use   Smoking status: Never   Smokeless tobacco: Never    Home Medications Prior to Admission medications   Medication Sig Start Date End Date Taking? Authorizing Provider  albuterol (VENTOLIN HFA) 108 (90 Base) MCG/ACT inhaler SMARTSIG:1-2 Puff(s) By Mouth PRN 02/04/21   [provider]  buprenorphine-naloxone (SUBOXONE) 8-2 mg SUBL SL tablet Place 1 tablet under the tongue 2 (two) times daily. 03/26/21   [provider]  ciprofloxacin (CIPRO) 500 MG tablet Take 500 mg by mouth 2 (two) times daily. 03/07/21   [provider]  clindamycin (CLEOCIN) 300 MG capsule Take 300 mg by mouth 3 (three) times daily. 03/14/21   [provider]  doxycycline (ADOXA) 100 MG tablet Take 100 mg by mouth 2 (two) times daily. 03/07/21   [provider]  fluvoxaMINE (LUVOX) 50 MG tablet Take 50 mg by mouth at bedtime.    [provider]  ibuprofen (ADVIL) 200 MG tablet Take by mouth. 03/07/10   [provider]  lisinopril (PRINIVIL,ZESTRIL) 20 MG tablet Take 20 mg by mouth daily.    [provider]  meloxicam (MOBIC) 7.5 MG tablet Take 7.5 mg by mouth 2 (two) times daily as needed. 03/24/21   [provider]  neomycin-polymyxin-hydrocortisone (CORTISPORIN) OTIC solution  Apply 2 drops to the ingrown toenail site twice daily. Cover with band-aid. 09/09/17   Park Liter, DPM  phentermine (ADIPEX-P) 37.5 MG tablet Take 37.5 mg by mouth daily. 03/09/21   [provider]  sulfamethoxazole-trimethoprim (BACTRIM DS) 800-160 MG tablet Take 1 tablet by mouth 2 (two) times daily. 03/16/21   Asencion Islam, DPM  testosterone cypionate (DEPOTESTOSTERONE CYPIONATE) 200 MG/ML injection Inject into the muscle.    [provider]  topiramate (TOPAMAX) 50 MG tablet Take 50 mg by mouth every morning. 02/02/21   [provider]    Allergies    Patient has no known allergies.  Review of Systems   Review of Systems  All other systems reviewed and are negative.  Physical Exam Updated Vital Signs BP (!) 146/96 (BP Location: Right Arm)   Pulse 81   Temp 98.2 F (36.8 C)   Resp 18   Ht 6' (1.829 m)   Wt (!) 145.2 kg   SpO2 99%   BMI 43.40 kg/m   Physical Exam Vitals reviewed.  Constitutional:      Appearance: Normal appearance.  Skin:    General: Skin is warm.     Comments: Dressing moved, small piece of Surgicel in place.  No bleeding     Neurological:     General: No focal deficit present.     Mental Status: He is alert.  Psychiatric:  Mood and Affect: Mood normal.    ED Results / Procedures / Treatments   Labs (all labs ordered are listed, but only abnormal results are displayed) Labs Reviewed - No data to display  EKG None  Radiology No results found.  Procedures Procedures   Medications Ordered in ED Medications - No data to display  ED Course  I have reviewed the triage vital signs and the nursing notes.  Pertinent labs & imaging results that were available during my care of the patient were reviewed by me and considered in my medical decision making (see chart for details).    MDM Rules/Calculators/A&P                           MDM:  Pt observed,  no bleeding.  Pt counseled on varicose veins and  pressue to area if any further bleeding  Final Clinical Impression(s) / ED Diagnoses Final diagnoses:  Bleeding from varicose vein    Rx / DC Orders ED Discharge Orders     None     An After Visit Summary was printed and given to the patient.    Elson Areas, [place] 07/13/21 1159    Terald Sleeper, MD 07/13/21 [phone]

## 2021-07-13 NOTE — Discharge Instructions (Addendum)
Follow up with your Physician. There are several vein clinics that may be able to help you with varicose veins

## 2022-01-15 ENCOUNTER — Emergency Department (HOSPITAL_COMMUNITY)
Admission: EM | Admit: 2022-01-15 | Discharge: 2022-01-15 | Disposition: A | Discharge status: Home / Self Care | Payer: BC Managed Care – PPO | Attending: Emergency Medicine | Admitting: Emergency Medicine

## 2022-01-15 DIAGNOSIS — I83891 Varicose veins of right lower extremities with other complications: Secondary | ICD-10-CM

## 2022-01-15 DIAGNOSIS — I83811 Varicose veins of right lower extremities with pain: Secondary | ICD-10-CM | POA: Diagnosis not present

## 2022-01-15 MED ORDER — SILVER NITRATE-POT NITRATE 75-25 % EX MISC
1.0000 | Freq: Once | CUTANEOUS | Status: AC
  Administered 2022-01-15: 1 via TOPICAL
  Filled 2022-01-15: qty 1

## 2022-01-15 NOTE — Discharge Instructions (Signed)
As we discussed, your varicose vein was cauterized in the ER today.  I have also put a dressing around your wound which you should leave on for 24 hours to ensure prevention of rebleeding.  Please follow-up with your vein specialist for further management of this.  Return if development of any new or worsening symptoms.

## 2022-01-15 NOTE — ED Triage Notes (Addendum)
Pt. Stated, my lower leg started bleeding last night , I have phlebitis, I couldn't get it stopped so I applied a belt around it all night. A tourniquet . This is the 3rd time this happened. Right leg is larger circumference is larger then left. Unwrapped the leg and bleeding controlled.

## 2022-01-15 NOTE — ED Provider Notes (Signed)
[facility] EMERGENCY DEPARTMENT Provider Note   CSN: [id number] Arrival date & time: 01/15/22  1116     History  Chief Complaint  Patient presents with   Bleeding varicose vein    Todd Jock. is a 46 y.o. male.  Patient with no pertinent past medical history presents today with complaints of bleeding varicose vein. States that yesterday he was in the shower and noted that a varicose vein on his right lower leg spontaneously ruptured.  He states that immediately afterwards he took one of his mother's Depends and a belt and tied it around his leg which stopped the bleeding.  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 He has not removed the dressing since to see if it was still bleeding. States that he has a significant history of similar events and has required cautery in the past to stop bleeding. He denies any other complaints. He is not currently anticoagulated.  The history is provided by the patient. No language interpreter was used.      Home Medications Prior to Admission medications   Medication Sig Start Date End Date Taking? Authorizing Provider  albuterol (VENTOLIN HFA) 108 (90 Base) MCG/ACT inhaler SMARTSIG:1-2 Puff(s) By Mouth PRN 02/04/21   [provider]  buprenorphine-naloxone (SUBOXONE) 8-2 mg SUBL SL tablet Place 1 tablet under the tongue 2 (two) times daily. 03/26/21   [provider]  ciprofloxacin (CIPRO) 500 MG tablet Take 500 mg by mouth 2 (two) times daily. 03/07/21   [provider]  clindamycin (CLEOCIN) 300 MG capsule Take 300 mg by mouth 3 (three) times daily. 03/14/21   [provider]  doxycycline (ADOXA) 100 MG tablet Take 100 mg by mouth 2 (two) times daily. 03/07/21   [provider]  fluvoxaMINE (LUVOX) 50 MG tablet Take 50 mg by mouth at bedtime.    [provider]  ibuprofen (ADVIL)  200 MG tablet Take by mouth. 03/07/10   [provider]  lisinopril (PRINIVIL,ZESTRIL) 20 MG tablet Take 20 mg by mouth daily.    [provider]  meloxicam (MOBIC) 7.5 MG tablet Take 7.5 mg by mouth 2 (two) times daily as needed. 03/24/21   [provider]  neomycin-polymyxin-hydrocortisone (CORTISPORIN) OTIC solution Apply 2 drops to the ingrown toenail site twice daily. Cover with band-aid. 09/09/17   Park Liter, DPM  phentermine (ADIPEX-P) 37.5 MG tablet Take 37.5 mg by mouth daily. 03/09/21   [provider]  sulfamethoxazole-trimethoprim (BACTRIM DS) 800-160 MG tablet Take 1 tablet by mouth 2 (two) times daily. 03/16/21   Asencion Islam, DPM  testosterone cypionate (DEPOTESTOSTERONE CYPIONATE) 200 MG/ML injection Inject into the muscle.    [provider]  topiramate (TOPAMAX) 50 MG tablet Take 50 mg by mouth every morning. 02/02/21   [provider]      Allergies    Patient has no known allergies.    Review of Systems   Review of Systems  Constitutional:  Negative for chills and fever.  Skin:  Positive for wound.  All other systems reviewed and are negative.  Physical Exam Updated Vital Signs BP 126/70 (BP Location: Right Arm)    Pulse 81    Temp 98.4 F (36.9 C) (Oral)    Resp 20    SpO2 99%  Physical Exam Vitals and nursing note reviewed.  Constitutional:      General: He  is not in acute distress.    Appearance: Normal appearance. He is normal weight. He is not ill-appearing, toxic-appearing or diaphoretic.  HENT:     Head: Normocephalic and atraumatic.  Cardiovascular:     Rate and Rhythm: Normal rate.  Pulmonary:     Effort: Pulmonary effort is normal. No respiratory distress.  Musculoskeletal:        General: Normal range of motion.     Cervical back: Normal range of motion.     Comments: Anterior RLE with small tortuous varicose vein present. No active bleeding at this time. DP and PT pulses intact and 2+  bilaterally  Skin:    General: Skin is warm and dry.  Neurological:     General: No focal deficit present.     Mental Status: He is alert.  Psychiatric:        Mood and Affect: Mood normal.        Behavior: Behavior normal.    ED Results / Procedures / Treatments   Labs (all labs ordered are listed, but only abnormal results are displayed) Labs Reviewed - No data to display  EKG None  Radiology No results found.  Procedures Procedures    Medications Ordered in ED Medications  silver nitrate applicators applicator 1 Stick (has no administration in time range)    ED Course/ Medical Decision Making/ A&P                           Medical Decision Making Risk Prescription drug management.   Patient presents today with bleeding varicose vein. No active bleeding noted on my exam, however patient states that he did remove the bandage in the room himself and states that blood 'shot out' of the wound.   I have cauterized the wound with silver nitrate and placed gauze and dressing over the wound, patient tolerated this without difficulty. No active bleeding noted with manipulation of the wound. Plan to leave the dressing on for 24 hours and follow-up with his vein specialist for further management. Patient has no other complaints at this time. He is afebrile, non-toxic appearing, and in no acute distress with reassuring vital signs.  Patient endorses minimal blood loss associated with this, therefore will forego laboratory testing at this time.  He is stable for discharge at this time, educated on red flag symptoms of prompt immediate return.  Discharged in stable condition.  Findings and plan of care discussed with supervising physician Dr. Rubin Payor who is in agreement.    Final Clinical Impression(s) / ED Diagnoses Final diagnoses:  Bleeding from varicose veins of lower extremity, right    Rx / DC Orders ED Discharge Orders     None     An After Visit Summary was  printed and given to the patient.     Vear Clock 01/15/22 1429    Benjiman Core, MD 01/16/22 [phone]

## 2022-01-15 NOTE — ED Provider Triage Note (Signed)
Emergency Medicine Provider Triage Evaluation Note  Todd Hernandez , a 46 y.o. male  was evaluated in triage.  Pt complains of bleeding varicose vein on right lower leg since this morning. Tried to stop the bleeding using one of his mother's diapers and a belt.  Review of Systems  Positive: bleeding Negative: weakness  Physical Exam  BP 115/77 (BP Location: Left Arm)    Pulse 82    Temp 98.4 F (36.9 C) (Oral)    Resp 16    SpO2 95%  Gen:   Awake, no distress   Resp:  Normal effort  MSK:   Moves extremities without difficulty  Other:  Dressing and belt removed and vein no longer bleeding  Medical Decision Making  Medically screening exam initiated at 11:46 AM.  Appropriate orders placed.  Todd Hernandez. was informed that the remainder of the evaluation will be completed by another provider, this initial triage assessment does not replace that evaluation, and the importance of remaining in the ED until their evaluation is complete.     Jennamarie Goings T, PA-C 01/15/22 1148

## 2022-10-24 ENCOUNTER — Other Ambulatory Visit: Payer: Self-pay | Admitting: *Deleted

## 2022-10-24 DIAGNOSIS — M79604 Pain in right leg: Secondary | ICD-10-CM

## 2022-10-31 ENCOUNTER — Ambulatory Visit (INDEPENDENT_AMBULATORY_CARE_PROVIDER_SITE_OTHER): Payer: Commercial Managed Care - HMO | Admitting: Physician Assistant

## 2022-10-31 ENCOUNTER — Ambulatory Visit (HOSPITAL_COMMUNITY)
Admission: RE | Admit: 2022-10-31 | Discharge: 2022-10-31 | Disposition: A | Discharge status: Home / Self Care | Payer: Commercial Managed Care - HMO | Source: Ambulatory Visit | Attending: Vascular Surgery | Admitting: Vascular Surgery

## 2022-10-31 VITALS — BP 155/91 | HR 74 | Temp 97.2°F | Resp 16 | Ht 72.0 in | Wt 372.0 lb

## 2022-10-31 DIAGNOSIS — M79604 Pain in right leg: Secondary | ICD-10-CM | POA: Diagnosis present

## 2022-10-31 DIAGNOSIS — I83811 Varicose veins of right lower extremities with pain: Secondary | ICD-10-CM

## 2022-10-31 DIAGNOSIS — I872 Venous insufficiency (chronic) (peripheral): Secondary | ICD-10-CM

## 2022-10-31 NOTE — Progress Notes (Signed)
Requested by:  Lars Mage, NP [address] [place],  [place] [address]  Reason for consultation: RLE swelling with varicose veins    History of Present Illness   Todd Groeneveld. is a 46 y.o. (07-Dec-1975) male who presents for evaluation of right lower extremity swelling with painful varicose veins.  He states that he is dealt with right lower extremity swelling for years, but it is worsened over the past 6 months.  His lower leg swelling is worse after sitting or standing for prolonged periods of time.  His right lower extremity will ache and throb intermittently, especially behind his knee.  He has attempted thigh-high compression stockings and elevation before but did not experience much benefit.  He did have 2 bleeding events from reticular veins on his right shin.  This was treated with cautery by [facility].  He has not had any bleeding events since.  He states that they tried to place him on Eliquis for superficial venous clots, but he has not taken it for months.  He denies a history of DVT.  History reviewed. No pertinent past medical history.  History reviewed. No pertinent surgical history.  Social History   Socioeconomic History   Marital status: Single    Spouse name: Not on file   Number of children: Not on file   Years of education: Not on file   Highest education level: Not on file  Occupational History   Not on file  Tobacco Use   Smoking status: Never   Smokeless tobacco: Never  Substance and Sexual Activity   Alcohol use: Not on file   Drug use: Not on file   Sexual activity: Not on file  Other Topics Concern   Not on file  Social History Narrative   Not on file   Social Determinants of Health   Financial Resource Strain: Not on file  Food Insecurity: Not on file  Transportation Needs: Not on file  Physical Activity: Not on file  Stress: Not on file  Social Connections: Not on file  Intimate Partner Violence: Not on file   History  reviewed. No pertinent family history.  Current Outpatient Medications  Medication Sig Dispense Refill   albuterol (VENTOLIN HFA) 108 (90 Base) MCG/ACT inhaler SMARTSIG:1-2 Puff(s) By Mouth PRN (Patient not taking: Reported on 10/31/2022)     buprenorphine-naloxone (SUBOXONE) 8-2 mg SUBL SL tablet Place 1 tablet under the tongue 2 (two) times daily.     ciprofloxacin (CIPRO) 500 MG tablet Take 500 mg by mouth 2 (two) times daily. (Patient not taking: Reported on 10/31/2022)     clindamycin (CLEOCIN) 300 MG capsule Take 300 mg by mouth 3 (three) times daily. (Patient not taking: Reported on 10/31/2022)     doxycycline (ADOXA) 100 MG tablet Take 100 mg by mouth 2 (two) times daily. (Patient not taking: Reported on 10/31/2022)     fluvoxaMINE (LUVOX) 50 MG tablet Take 50 mg by mouth at bedtime. (Patient not taking: Reported on 10/31/2022)     ibuprofen (ADVIL) 200 MG tablet Take by mouth.     lisinopril (PRINIVIL,ZESTRIL) 20 MG tablet Take 20 mg by mouth daily. (Patient not taking: Reported on 10/31/2022)     meloxicam (MOBIC) 7.5 MG tablet Take 7.5 mg by mouth 2 (two) times daily as needed. (Patient not taking: Reported on 10/31/2022)     neomycin-polymyxin-hydrocortisone (CORTISPORIN) OTIC solution Apply 2 drops to the ingrown toenail site twice daily. Cover with band-aid. 10 mL 0  phentermine (ADIPEX-P) 37.5 MG tablet Take 37.5 mg by mouth daily. (Patient not taking: Reported on 10/31/2022)     sulfamethoxazole-trimethoprim (BACTRIM DS) 800-160 MG tablet Take 1 tablet by mouth 2 (two) times daily. 28 tablet 0   testosterone cypionate (DEPOTESTOSTERONE CYPIONATE) 200 MG/ML injection Inject into the muscle. (Patient not taking: Reported on 10/31/2022)     topiramate (TOPAMAX) 50 MG tablet Take 50 mg by mouth every morning.     No current facility-administered medications for this visit.    No Known Allergies  REVIEW OF SYSTEMS (negative unless checked):   Cardiac:  []  Chest pain or chest  pressure? []  Shortness of breath upon activity? []  Shortness of breath when lying flat? []  Irregular heart rhythm?  Vascular:  []  Pain in calf, thigh, or hip brought on by walking? []  Pain in feet at night that wakes you up from your sleep? []  Blood clot in your veins? [x]  Leg swelling?  Pulmonary:  []  Oxygen at home? []  Productive cough? []  Wheezing?  Neurologic:  []  Sudden weakness in arms or legs? []  Sudden numbness in arms or legs? []  Sudden onset of difficult speaking or slurred speech? []  Temporary loss of vision in one eye? []  Problems with dizziness?  Gastrointestinal:  []  Blood in stool? []  Vomited blood?  Genitourinary:  []  Burning when urinating? []  Blood in urine?  Psychiatric:  []  Major depression  Hematologic:  []  Bleeding problems? []  Problems with blood clotting?  Dermatologic:  []  Rashes or ulcers?  Constitutional:  []  Fever or chills?  Ear/Nose/Throat:  []  Change in hearing? []  Nose bleeds? []  Sore throat?  Musculoskeletal:  []  Back pain? []  Joint pain? []  Muscle pain?   Physical Examination     Vitals:   10/31/22 1403  BP: (!) 155/91  Pulse: 74  Resp: 16  Temp: (!) 97.2 F (36.2 C)  TempSrc: Temporal  SpO2: 98%  Weight: (!) 372 lb (168.7 kg)  Height: 6' (1.829 m)   Body mass index is 50.45 kg/m.  General:  WDWN in NAD; vital signs documented above Gait: Not observed HENT: WNL, normocephalic Pulmonary: normal non-labored breathing  Cardiac: Regular heart rate Abdomen: soft, NT, no masses Skin: without rashes Vascular Exam/Pulses: Palpable DP pulses bilaterally Extremities: with varicose veins/reticular veins/edema in right lower extremity, without stasis pigmentation, without lipodermatosclerosis, without ulcers Musculoskeletal: no muscle wasting or atrophy  Neurologic: A&O X 3;  No focal weakness or paresthesias are detected Psychiatric:  The pt has Normal affect.  Non-invasive Vascular Imaging   RLE Venous  Insufficiency Duplex (10/31/2022):  RIGHT         Reflux NoReflux  Reflux  Diameter cmsComments                                         Yes     Time                                        +--------------+---------+------+----------+------------+------------------  ----+  CFV                    yes  >1 second                                      +--------------+---------+------+----------+------------+------------------  ----+  FV prox       no                                                            +--------------+---------+------+----------+------------+------------------  ----+  FV mid        no                                   duplicated vein          +--------------+---------+------+----------+------------+------------------  ----+  FV dist       no                                                            +--------------+---------+------+----------+------------+------------------  ----+  Popliteal    no                                                            +--------------+---------+------+----------+------------+------------------  ----+  GSV at [facility]              yes   >500 ms      1.63                             +--------------+---------+------+----------+------------+------------------  ----+  GSV prox thigh          yes   >500 ms      1.85    aneurysmal               +--------------+---------+------+----------+------------+------------------  ----+  GSV mid thigh           yes   >500 ms      0.90                             +--------------+---------+------+----------+------------+------------------  ----+  GSV dist thigh          yes   >500 ms      0.80                             +--------------+---------+------+----------+------------+------------------  ----+  GSV at knee             yes   >500 ms      1.21    branching  varicosities   +--------------+---------+------+----------+------------+------------------  ----+  GSV prox calf           yes   >500 ms      0.53                             +--------------+---------+------+----------+------------+------------------  ----+  SSV Pop Fossa           yes   >500 ms  0.32                             +--------------+---------+------+----------+------------+------------------  ----+  SSV prox calf no                           0.37                             +--------------+---------+------+----------+------------+------------------  ----+     Medical Decision Making   Todd Jock. is a 46 y.o. male who presents for evaluation of right lower extremity swelling and painful varicose veins  Based on the patient's reflux study, there is venous reflux present in the common femoral vein and the entirety of the greater saphenous vein, including the saphenofemoral junction.  There is also venous reflux in the small saphenous vein at the popliteal fossa.  The greater saphenous vein is >110mm throughout the entire thigh with an aneurysmal portion of the proximal thigh.  There are multiple branching varicosities of the greater saphenous vein at the knee The patient has experienced some bleeding events from small varicosities around his right shin in 2022 that required cautery.  He has not had any bleeding events since. He has previously attempted compression stockings, however he could not find any that fit him. I discussed with the patient the use of 20-30 mm thigh high compression stockings and need for 3 month trial of such.  Since the patient's body habitus limits his ability to fit any compression stockings, we will attempt moderate compressive therapy with ace wrap bandages daily.  I have encouraged the patient to wear these ace wraps to the top of his thigh.  He will also elevate his legs and exercise to help improve leg swelling. The patient will  follow up in 3 months with one of our providers for possible right lower extremity ablation, stab phlebectomy, and or vein stripping  Ernestene Mention, PA-C [facility] Office: [phone]  10/31/2022, 3:02 PM  Clinic MD: [facility]

## 2023-01-29 ENCOUNTER — Encounter: Payer: Self-pay | Admitting: Vascular Surgery

## 2023-01-29 ENCOUNTER — Ambulatory Visit: Payer: Commercial Managed Care - HMO | Admitting: Vascular Surgery

## 2023-01-29 VITALS — BP 146/91 | HR 89 | Temp 97.8°F | Resp 18 | Ht 72.0 in | Wt 380.0 lb

## 2023-01-29 DIAGNOSIS — I872 Venous insufficiency (chronic) (peripheral): Secondary | ICD-10-CM

## 2023-01-29 DIAGNOSIS — I83811 Varicose veins of right lower extremities with pain: Secondary | ICD-10-CM

## 2023-01-29 NOTE — Progress Notes (Signed)
REASON FOR VISIT:   Chronic venous insufficiency  MEDICAL ISSUES:   CHRONIC VENOUS INSUFFICIENCY: This patient has CEAP C4 venous disease.  He is having significant symptoms in the right leg and is failed conservative treatment including leg elevation, exercise, and thigh-high compression stockings with a gradient of 20 to 30 mmHg.  We have again discussed the importance of daily leg elevation and the proper positioning for this.  I encouraged him to continue to wear his compression stockings.  I encouraged him to avoid prolonged sitting and standing.  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  We discussed importance of exercise specifically walking and water aerobics.  We also discussed the importance of maintaining a healthy weight as central obesity especially increases lower extremity venous pressure.  I think he would be a candidate for laser ablation of the right great saphenous vein although there are some challenges.  The vein is fairly deep in several areas.  There are also some areas with marked tortuosity and we may not be able to traverse these areas with the wire.  I explained that if we cannot traverse the zeros with the wire we would cannulate above this and treat a fairly long length of the thigh.  There is 1 of these areas just distal to the saphenofemoral junction so we may not be able to treat all the way up to the saphenofemoral junction.  I have discussed the indications for endovenous laser ablation of the right GSV, that is to lower the pressure in the veins and potentially help relieve the symptoms from venous hypertension.  I have also discussed alternative options such as conservative treatment as described above. I have discussed the potential complications of the procedure, including bleeding, bruising, leg swelling, deep venous thrombosis (<1% risk), or failure of the vein to close <1% risk).  I have also explained that venous  insufficiency is a chronic disease, and that the patient is at risk for recurrent varicose veins in the future.  All of the patient's questions were encouraged and answered. They are agreeable to proceed.   I have discussed with the patient the indications for stab phlebectomy.  I have explained to the patient that that will have small scars from the stab incisions.  I explained that the other risks include bruising, bleeding, and phlebitis.      HPI:   Todd Hernandez. is a pleasant 47 y.o. male who was seen by Luisa Dago, PA on 10/31/2022 with painful varicose veins of the right lower extremity and leg swelling.  He had had this issue for years.  This had gotten worse over the last 6 months.  The swelling was worse after sitting or standing.  He described aching pain and throbbing in his right leg.  He did have 2 bleeding episodes from some reticular veins on his right shin.  This was treated with cautery by [facility].  He has no previous history of DVT.  He was encouraged to elevate his legs, was prescribed thigh-high compression stockings with a gradient of 20 to 30 mmHg, and encouraged exercise.  He comes in for 38-monthfollow-up visit.  On my history, the patient describes significant aching pain and heaviness in the right leg which is aggravated by standing and sitting and relieved with elevation.  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  He feels better when  he gets up and walks around some intermittently.  He is unaware of any previous history of DVT.  He did have 2 bleeding episodes from a small telangiectasia on his right leg.  First episode was a year and a half ago.  The second episode was about a year ago.  He has been wearing his thigh-high compression stockings.  They do tend to roll down.  Has been elevating his legs and trying to walk more.  He has no previous history of DVT.  He has had no previous venous  procedures.  History reviewed. No pertinent past medical history.  History reviewed. No pertinent family history.  SOCIAL HISTORY: Social History   Tobacco Use   Smoking status: Never   Smokeless tobacco: Never  Substance Use Topics   Alcohol use: Not Currently    No Known Allergies  Current Outpatient Medications  Medication Sig Dispense Refill   buprenorphine-naloxone (SUBOXONE) 8-2 mg SUBL SL tablet Place 1 tablet under the tongue 2 (two) times daily.     fluvoxaMINE (LUVOX) 50 MG tablet Take 50 mg by mouth at bedtime.     ibuprofen (ADVIL) 200 MG tablet Take by mouth.     neomycin-polymyxin-hydrocortisone (CORTISPORIN) OTIC solution Apply 2 drops to the ingrown toenail site twice daily. Cover with band-aid. 10 mL 0   sulfamethoxazole-trimethoprim (BACTRIM DS) 800-160 MG tablet Take 1 tablet by mouth 2 (two) times daily. 28 tablet 0   testosterone cypionate (DEPOTESTOSTERONE CYPIONATE) 200 MG/ML injection Inject into the muscle.     topiramate (TOPAMAX) 50 MG tablet Take 50 mg by mouth every morning.     albuterol (VENTOLIN HFA) 108 (90 Base) MCG/ACT inhaler SMARTSIG:1-2 Puff(s) By Mouth PRN (Patient not taking: Reported on 10/31/2022)     ciprofloxacin (CIPRO) 500 MG tablet Take 500 mg by mouth 2 (two) times daily. (Patient not taking: Reported on 10/31/2022)     clindamycin (CLEOCIN) 300 MG capsule Take 300 mg by mouth 3 (three) times daily. (Patient not taking: Reported on 10/31/2022)     doxycycline (ADOXA) 100 MG tablet Take 100 mg by mouth 2 (two) times daily. (Patient not taking: Reported on 10/31/2022)     lisinopril (PRINIVIL,ZESTRIL) 20 MG tablet Take 20 mg by mouth daily. (Patient not taking: Reported on 10/31/2022)     meloxicam (MOBIC) 7.5 MG tablet Take 7.5 mg by mouth 2 (two) times daily as needed. (Patient not taking: Reported on 10/31/2022)     phentermine (ADIPEX-P) 37.5 MG tablet Take 37.5 mg by mouth daily. (Patient not taking: Reported on 10/31/2022)     No  current facility-administered medications for this visit.    REVIEW OF SYSTEMS:  '[X]'$  denotes positive finding, '[ ]'$  denotes negative finding Cardiac  Comments:  Chest pain or chest pressure:    Shortness of breath upon exertion:    Short of breath when lying flat:    Irregular heart rhythm:        Vascular    Pain in calf, thigh, or hip brought on by ambulation:    Pain in feet at night that wakes you up from your sleep:     Blood clot in your veins:    Leg swelling:  x       Pulmonary    Oxygen at home:    Productive cough:     Wheezing:         Neurologic    Sudden weakness in arms or legs:     Sudden numbness in arms or  legs:     Sudden onset of difficulty speaking or slurred speech:    Temporary loss of vision in one eye:     Problems with dizziness:         Gastrointestinal    Blood in stool:     Vomited blood:         Genitourinary    Burning when urinating:     Blood in urine:        Psychiatric    Major depression:         Hematologic    Bleeding problems:    Problems with blood clotting too easily:        Skin    Rashes or ulcers:        Constitutional    Fever or chills:     PHYSICAL EXAM:   Vitals:   01/29/23 1336  BP: (!) 146/91  Pulse: 89  Resp: 18  Temp: 97.8 F (36.6 C)  TempSrc: Temporal  SpO2: 96%  Weight: (!) 380 lb (172.4 kg)  Height: 6' (1.829 m)   Body mass index is 51.54 kg/m.  GENERAL: The patient is a well-nourished male, in no acute distress. The vital signs are documented above. CARDIAC: There is a regular rate and rhythm.  VASCULAR: I do not detect carotid bruits. He has palpable pedal pulses. He has significant swelling of the right lower extremity with hyperpigmentation.  He has telangiectasias and some large varicose veins.  There is an especially large vein in his distal medial thigh.     I did look at his right great saphenous vein myself with the [employer].  There were multiple issues with this vein.  There are  several segments which are markedly tortuous and I am concerned that we may not be able to traverse the wire through these areas.  However the we should be able to treat a fairly long segment even if we are unable to cross these areas.  The saphenofemoral junction also looks challenging.   PULMONARY: There is good air exchange bilaterally without wheezing or rales. ABDOMEN: Soft and non-tender with normal pitched bowel sounds.  MUSCULOSKELETAL: There are no major deformities or cyanosis. NEUROLOGIC: No focal weakness or paresthesias are detected. SKIN: There are no ulcers or rashes noted. PSYCHIATRIC: The patient has a normal affect.  DATA:    VENOUS DUPLEX: I have independently interpreted his venous duplex scan that was done on 10/31/2022..  This was of the right lower extremity only.  This showed no evidence of DVT in the right lower extremity.  There was deep venous reflux involving the common femoral vein.  There was superficial venous reflux in the right great saphenous vein from the saphenofemoral junction to the proximal calf.  The diameters of the vein ranged from 8-18.5 mm in the thigh.  The results of this study are summarized on the diagram below.    A total of 43 minutes was spent on this visit. 24 minutes was face to face time. More than 50% of the time was spent on counseling and coordinating with the patient.    Deitra Mayo [facility] [phone]

## 2023-04-09 ENCOUNTER — Telehealth: Payer: Self-pay | Admitting: *Deleted

## 2023-04-09 NOTE — Telephone Encounter (Signed)
Following up with Mr. Todd Hernandez regarding his new [place] Medicaid Tailored plan.  Spoke with Todd Hernandez at [facility] call reference # [phone] regarding the [facility] Medicaid Tailored Plan.  She states that the Tailored Plan launches 05-19-2023.  She states that Dr. Edilia Bo and [facility] are not contracted providers with [facility] or Tailored Plan/[place] Medicaid.  Called Todd Hernandez and informed him that Tailored Plan/ [place] Medicaid does not start until 05-19-2023 and that Dr. Edilia Bo and [facility] are not contracted providers with [facility] or Tailored Plan.  Advised him to call his case worker and have her help him find a vascular and vein specialist that are in network with his plan.

## 2023-05-20 ENCOUNTER — Ambulatory Visit (INDEPENDENT_AMBULATORY_CARE_PROVIDER_SITE_OTHER): Payer: MEDICAID | Admitting: Podiatry

## 2023-05-20 ENCOUNTER — Ambulatory Visit (INDEPENDENT_AMBULATORY_CARE_PROVIDER_SITE_OTHER): Payer: MEDICAID

## 2023-05-20 DIAGNOSIS — M19071 Primary osteoarthritis, right ankle and foot: Secondary | ICD-10-CM | POA: Diagnosis not present

## 2023-05-20 DIAGNOSIS — E669 Obesity, unspecified: Secondary | ICD-10-CM | POA: Diagnosis not present

## 2023-05-20 NOTE — Progress Notes (Signed)
Subjective:  Patient ID: Todd Jock., male    DOB: 1976-10-22,  MRN: [id number]  Chief Complaint  Patient presents with   Callouses    Callus trim     Ankle Problem    Right ankle arthritis - bone spurs    47 y.o. male presents with concern for severe arthritis in the right ankle.  He has previously seen Dr. Marylene Land.  She recommended that he be seen at [facility] for possible ankle fusion.  Referral was placed however they cannot see him until next year.  He has severe pain in his right ankle he is unable to bear weight for very long on the right ankle due to this severe osteoarthritis.  Patient does admit he has obesity issue and has trouble with his weight.  He is wearing an AFO brace for dropfoot on the right ankle despite not having dropfoot for help with his pain and to get around.  Has had an MRI as well as x-rays completed recently with concern for severe arthritis of the right tibiotalar joint.  No past medical history on file.  No Known Allergies  ROS: Negative except as per HPI above  Objective:  General: AAO x3, NAD  Dermatological: With inspection and palpation of the right and left lower extremities there are no open sores, no preulcerative lesions, no rash or signs of infection present. Nails are of normal length thickness and coloration.   Vascular:  Dorsalis Pedis artery and Posterior Tibial artery pedal pulses are 2/4 bilateral.  Capillary fill time < 3 sec to all digits.  Varicosities are present in the bilateral lower extremity and edema as well  Neruologic: Grossly intact via light touch bilateral. Protective threshold intact to all sites bilateral.   Musculoskeletal: Edema noted about the right ankle.  There is severe pain with palpation and with weightbearing in the right tibiotalar and rear foot.  Patient has significantly decreased range of motion with dorsiflexion plantarflexion of the right ankle.  Pain with any range of motion.  Gait: Unassisted,  Nonantalgic.   No images are attached to the encounter.  Radiographs:  Date: 05/20/2023 XR right ankle weightbearing AP/Lateral/Oblique   Findings: Near complete loss of the tibiotalar joint on lateral and AP view.  There is valgus angulation of the talus in comparison the long axis of the tibia.  Severe osteoarthritic changes with subchondral sclerosis joint irregularity and bone-on-bone contact. Assessment:   1. Arthritis of right ankle   2. Obesity without serious comorbidity, unspecified classification, unspecified obesity type      Plan:  Patient was evaluated and treated and all questions answered.  # Severe arthritis of the right ankle joint -Discussed with patient that he does have significant osteoarthritis of the right ankle. -Patient is unable to work related to this condition and the severe debilitating pain he has when trying to ambulate -Explained conservative or surgical treatment methods with the patient -Conservatively we could proceed with steroid injection which she has had in the past and did provide some relief.  Patient would like a steroid injection today. -After sterile prep injected 1 cc half percent Marcaine plain with 1 cc Kenalog 10 into the anterior lateral aspect of the right ankle joint. -Continue with bracing including AFO on the right side -Discussed surgical treatment options with the patient.  Do agree with recommendation for right ankle arthrodesis.  Patient is not a good candidate for replacement related to his weight -Discussed that patient is a high risk candidate for  surgery given his obesity as well as other medical comorbidities and history of substance abuse -Patient require preoperative clearance and will need to see a primary care doctor to get cleared for surgery -Discussed with patient detail the risk benefits alternatives and possible complications as well as the expected postoperative recovery course and need for prolonged weightbearing to 6  weeks on the right side -Patient wishes to proceed with surgical invention.  He understands the risks and the possibility for need for revisional surgery and even loss of limb as a potential complication of surgery -Informed surgical site was obtained we will begin surgical planning          Corinna Gab, DPM [facility] / [facility]

## 2023-05-20 NOTE — Addendum Note (Signed)
Addended by: Carlena Hurl F on: 05/20/2023 03:41 PM   Modules accepted: Level of Service

## 2023-05-28 ENCOUNTER — Telehealth: Payer: Self-pay | Admitting: Podiatry

## 2023-05-28 NOTE — Telephone Encounter (Signed)
[facility]. Called to discuss surgery date. No answer, no voicemail set up

## 2023-10-06 ENCOUNTER — Ambulatory Visit: Payer: MEDICAID | Admitting: Podiatry

## 2023-10-20 ENCOUNTER — Encounter: Payer: Self-pay | Admitting: Podiatry

## 2023-10-20 ENCOUNTER — Ambulatory Visit (INDEPENDENT_AMBULATORY_CARE_PROVIDER_SITE_OTHER): Payer: MEDICAID | Admitting: Podiatry

## 2023-10-20 DIAGNOSIS — M216X1 Other acquired deformities of right foot: Secondary | ICD-10-CM

## 2023-10-20 DIAGNOSIS — M19071 Primary osteoarthritis, right ankle and foot: Secondary | ICD-10-CM

## 2023-10-20 MED ORDER — TRIAMCINOLONE ACETONIDE 10 MG/ML IJ SUSP
10.0000 mg | Freq: Once | INTRAMUSCULAR | Status: AC
  Administered 2023-10-20: 10 mg via INTRA_ARTICULAR

## 2023-10-21 NOTE — Progress Notes (Signed)
  Subjective:  Patient ID: Todd Jock., male    DOB: March 17, 1976,  MRN: [id number]  Chief Complaint  Patient presents with   Foot Pain    Rt Foot pain, has poor circulation.  Has been treated at wound clinic, has stocking for anti embolism.  Requesting an injection for pain    47 y.o. male presents with concern for severe arthritis in the right ankle.  He has previously seen Dr. Marylene Land, Dr. Annamary Rummage.  Patient comes in with right ankle arthritis and is requesting injection.  He is obese and does have history of venous insufficiency complicated with ulceration being treated by wound care center.  Patient has been wearing an AFO brace for dropfoot to the right ankle to help with the pain despite not having dropfoot. Some discussion of surgical intervention with other providers however he has been deemed high risk due to his comorbidities.  History reviewed. No pertinent past medical history.  No Known Allergies  ROS: Negative except as per HPI above  Objective:  General: AAO x3, NAD  Dermatological: Ulceration present to the right lateral ankle approximately 1.2 cm in diameter with fat layer exposed. Nails are of normal length thickness and coloration.   Vascular:  Dorsalis Pedis artery and Posterior Tibial artery pedal pulses are 2/4 bilateral.  Capillary fill time < 3 sec to all digits.  Varicosities are present in the bilateral lower extremity and edema as well  Neruologic: Grossly intact via light touch bilateral. Protective threshold intact to all sites bilateral.   Musculoskeletal: Edema noted about the right ankle.  There is severe pain with palpation and with weightbearing in the right tibiotalar and rear foot.  Patient has significantly decreased range of motion with dorsiflexion plantarflexion of the right ankle.  Pain with any range of motion.  Gait: Unassisted, Nonantalgic.   No images are attached to the encounter.  Radiographs:  Date: 05/20/2023 XR right ankle  weightbearing AP/Lateral/Oblique   Findings: Near complete loss of the tibiotalar joint on lateral and AP view.  There is valgus angulation of the talus in comparison the long axis of the tibia.  Severe osteoarthritic changes with subchondral sclerosis joint irregularity and bone-on-bone contact. Assessment:   1. Arthritis of right ankle      Plan:  Patient was evaluated and treated and all questions answered.  # Severe arthritis of the right ankle joint -Discussed with patient that he does have significant osteoarthritis of the right ankle. -Verbal consent obtained to proceed with corticosteroid injection to the right ankle -After sterile prep injected 0.5 cc half percent Marcaine plain with 1 cc Kenalog 10 into the anterior lateral aspect of the right ankle joint. -Continue with bracing including AFO on the right side.  Offered over-the-counter ASO brace due to insurance restrictions. Will inquire about possibility of custom brace with insurance coverage due to limitations with patient's body habitus. -Discussed surgical treatment options with the patient.  Do feel that he is a high risk surgical candidate due to his comorbidities and history of substance abuse.  Did advise that he can seek second opinion if necessary. -Venous ulcer managed by wound care center -Follow-up as needed          Bronwen Betters, DPM [facility] / [facility]

## 2023-11-04 ENCOUNTER — Other Ambulatory Visit: Payer: MEDICAID
